# Patient Record
Sex: Female | Born: 1955 | Race: White | Hispanic: No | Marital: Married | Smoking: Never smoker
Health system: Southern US, Community
[De-identification: ages and names within clinical notes are randomized; demographics above are authoritative.]

## PROBLEM LIST (undated history)

## (undated) DIAGNOSIS — Z789 Other specified health status: Secondary | ICD-10-CM

## (undated) HISTORY — PX: NASAL SEPTUM SURGERY: SHX37

## (undated) HISTORY — PX: BREAST REDUCTION SURGERY: SHX8

## (undated) HISTORY — PX: FOOT SURGERY: SHX648

---

## 2001-12-16 ENCOUNTER — Other Ambulatory Visit: Admission: RE | Admit: 2001-12-16 | Discharge: 2001-12-16 | Payer: Self-pay | Admitting: Internal Medicine

## 2005-06-22 ENCOUNTER — Ambulatory Visit (HOSPITAL_COMMUNITY): Admission: RE | Admit: 2005-06-22 | Discharge: 2005-06-22 | Payer: Self-pay | Admitting: Gastroenterology

## 2008-05-03 ENCOUNTER — Ambulatory Visit: Payer: Self-pay | Admitting: Internal Medicine

## 2008-05-10 ENCOUNTER — Ambulatory Visit: Payer: Self-pay | Admitting: Internal Medicine

## 2009-05-04 ENCOUNTER — Ambulatory Visit: Payer: Self-pay | Admitting: Internal Medicine

## 2010-05-16 ENCOUNTER — Ambulatory Visit: Payer: Self-pay | Admitting: Internal Medicine

## 2011-05-21 ENCOUNTER — Ambulatory Visit: Payer: Self-pay | Admitting: Internal Medicine

## 2011-12-14 ENCOUNTER — Encounter: Payer: Self-pay | Admitting: Obstetrics and Gynecology

## 2011-12-28 ENCOUNTER — Encounter: Payer: Self-pay | Admitting: Obstetrics and Gynecology

## 2012-01-28 ENCOUNTER — Encounter: Payer: Self-pay | Admitting: Obstetrics and Gynecology

## 2012-02-27 ENCOUNTER — Encounter: Payer: Self-pay | Admitting: Obstetrics and Gynecology

## 2012-05-23 ENCOUNTER — Ambulatory Visit: Payer: Self-pay | Admitting: Internal Medicine

## 2013-05-25 ENCOUNTER — Ambulatory Visit: Payer: Self-pay | Admitting: Internal Medicine

## 2013-12-27 HISTORY — PX: REDUCTION MAMMAPLASTY: SUR839

## 2015-07-05 ENCOUNTER — Other Ambulatory Visit: Payer: Self-pay | Admitting: Obstetrics and Gynecology

## 2015-07-05 ENCOUNTER — Other Ambulatory Visit: Payer: Self-pay | Admitting: Internal Medicine

## 2015-07-05 DIAGNOSIS — Z1231 Encounter for screening mammogram for malignant neoplasm of breast: Secondary | ICD-10-CM

## 2015-07-06 ENCOUNTER — Ambulatory Visit
Admission: RE | Admit: 2015-07-06 | Discharge: 2015-07-06 | Disposition: A | Discharge status: Home / Self Care | Source: Ambulatory Visit | Attending: Obstetrics and Gynecology | Admitting: Obstetrics and Gynecology

## 2015-07-06 DIAGNOSIS — Z1231 Encounter for screening mammogram for malignant neoplasm of breast: Secondary | ICD-10-CM

## 2015-07-19 NOTE — H&P (Signed)
GYN PRE-OP H&P   Subjective:    Teresa Booth is a 59 y.o. female who presents for pre-op planning for D&C hysteroscopy, polypectomy for endometrial polyp noted on endometrial biopsy done for PMB.   Hx of PMB: Pt was in her usual state of health until end of july when she experienced 4 days of light spotting. No pelvic pain. No abnl discharge. Went through menopause 6 yrs ago. No dysparunia.   Gynecologic History No LMP recorded. Patient is postmenopausal. Sexually active: yes Desires STD screening: no  Last Pap: 3 years ago. Results were: normal Last mammogram: last year. Results were: normal  Obstetric History                      OB History  Gravida Para Term Preterm AB SAB TAB Ectopic Multiple Living  # Outcome Date GA Lbr Len/2nd Weight Sex Delivery Anes PTL Lv  2 Term           1 Term               Past Medical History:  has a past medical history of Allergic rhinitis; Osteopenia; Menopause (06/04/2014); and Urinary urgency (06/04/2014). Problem List: has Osteopenia; Urinary urgency; Menopause; and Health care maintenance on her problem list. Past Surgical History:  has past surgical history that includes foot surgery and Breast surgery (12/2013). Family History: family history includes Hypertension in her other; Pulmonary fibrosis in her father; Thyroid disease in her other. Social History:  reports that she has never smoked. She has never used smokeless tobacco. She reports that she drinks alcohol. She reports that she does not use illicit drugs. +seat belt use, no DV, no depression Current Medications: has a current medication list which includes the following prescription(s): cholecalciferol and saccharomyces boulardii. Prior to encounter Medications:  Current Outpatient Prescriptions on File Prior to Visit  Medication Sig Dispense Refill  . cholecalciferol (VITAMIN D3) 1,000 unit tablet Take 1,000 Units by mouth  once daily.    Marland Kitchen SACCHAROMYCES BOULARDII (PROBIOTIC, S.BOULARDII, ORAL) Take 1 tablet by mouth once daily.     No current facility-administered medications on file prior to visit.   Allergies: is allergic to nsaids (non-steroidal anti-inflammatory drug) and shellfish containing products.  Review of Systems 14 systems reviewed pertinent positives and negatives as noted in the HPI and below.   Objective:       Filed Vitals:   07/01/15 0954  BP: 160/91  Pulse: 63   General appearance: alert, appears stated age and cooperative Head: Normocephalic, without obvious abnormality, atraumatic Eyes: conjunctivae/corneas clear. PERRL, EOM's intact. Fundi benign. Sclera anicteric. Throat: lips, mucosa, and tongue normal; teeth and gums normal Neck: no adenopathy, supple, symmetrical, trachea midline and thyroid not enlarged, symmetric, no tenderness/mass/nodules Back: symmetric, no curvature. ROM normal. No CVA tenderness. Lungs: clear to auscultation bilaterally Breasts: normal appearance, no masses or tenderness Heart: regular rate and rhythm, S1, S2 normal, no murmur, click, rub or gallop Abdomen: soft, non-tender; bowel sounds normal; no masses, no organomegaly Pelvic: cervix normal in appearance, external genitalia normal, no adnexal masses or tenderness, no cervical motion tenderness, rectovaginal septum normal, uterus normal size, shape, and consistency and vagina normal without discharge Extremities: extremities normal, atraumatic, no cyanosis or edema Skin: Skin color, texture, turgor normal. No rashes or lesions Lymph nodes: Cervical, supraclavicular, and axillary nodes normal.    Results: Pap NILM Endometrial bx: no carcinoma;  atrophic endometrium, endometrial polyps TVUS: 4.42mm EMS, normal uterus, normal ovaries  Assessment:     59yo G2P2 w/ PMB and evidence of endometrial polyps on biopsy. Will proceed with D&C hysteroscopy polypectomy.   Plan:    1. PMB,  endometrial polyp in post-menopausal patient - D&C hysteroscopy polypectomy discussed with patient Risks of surgery were discussed with the patient including but not limited to: bleeding which may require transfusion; infection which may require antibiotics; injury to uterus or surrounding organs; intrauterine scarring which may impair future fertility; need for additional procedures including laparotomy or laparoscopy; and other postoperative/anesthesia complications.  - Written informed consent was obtained.  This is a scheduled same-day surgery. She will have a postop visit in 2 weeks to review operative findings and pathology.  I spent 15 minutes with patient with >50% spent on counseling.  Burnett Corrente, MD

## 2015-07-26 ENCOUNTER — Encounter
Admission: RE | Admit: 2015-07-26 | Discharge: 2015-07-26 | Disposition: A | Discharge status: Home / Self Care | Source: Ambulatory Visit | Attending: Obstetrics and Gynecology | Admitting: Obstetrics and Gynecology

## 2015-07-26 DIAGNOSIS — Z79899 Other long term (current) drug therapy: Secondary | ICD-10-CM | POA: Diagnosis not present

## 2015-07-26 DIAGNOSIS — J309 Allergic rhinitis, unspecified: Secondary | ICD-10-CM | POA: Diagnosis not present

## 2015-07-26 DIAGNOSIS — M8580 Other specified disorders of bone density and structure, unspecified site: Secondary | ICD-10-CM | POA: Diagnosis not present

## 2015-07-26 DIAGNOSIS — R102 Pelvic and perineal pain: Secondary | ICD-10-CM | POA: Diagnosis not present

## 2015-07-26 DIAGNOSIS — N950 Postmenopausal bleeding: Secondary | ICD-10-CM | POA: Diagnosis present

## 2015-07-26 DIAGNOSIS — R3915 Urgency of urination: Secondary | ICD-10-CM | POA: Diagnosis not present

## 2015-07-26 HISTORY — DX: Other specified health status: Z78.9

## 2015-07-26 LAB — CBC
HCT: 43.7 % (ref 35.0–47.0)
Hemoglobin: 14.8 g/dL (ref 12.0–16.0)
MCH: 31.4 pg (ref 26.0–34.0)
MCHC: 33.9 g/dL (ref 32.0–36.0)
MCV: 92.6 fL (ref 80.0–100.0)
PLATELETS: 187 10*3/uL (ref 150–440)
RBC: 4.72 MIL/uL (ref 3.80–5.20)
RDW: 12.9 % (ref 11.5–14.5)
WBC: 5.5 10*3/uL (ref 3.6–11.0)

## 2015-07-26 MED ORDER — MOXIFLOXACIN HCL 0.5 % OP SOLN
OPHTHALMIC | Status: AC
  Filled 2015-07-26: qty 3

## 2015-07-26 NOTE — Patient Instructions (Signed)
  Your procedure is scheduled on: 07/29/15 Fri  Report to Day Surgery.2nd floor Medical [facility] To find out your arrival time please call [phone] between 1PM - 3PM on 07/28/15 Thurs  Remember: Instructions that are not followed completely may result in serious medical risk, up to and including death, or upon the discretion of your surgeon and anesthesiologist your surgery may need to be rescheduled.    _x___ 1. Do not eat food or drink liquids after midnight. No gum chewing or hard candies.     __x__ 2. No Alcohol for 24 hours before or after surgery.   ____ 3. Bring all medications with you on the day of surgery if instructed.    __x__ 4. Notify your doctor if there is any change in your medical condition     (cold, fever, infections).     Do not wear jewelry, make-up, hairpins, clips or nail polish.  Do not wear lotions, powders, or perfumes. You may wear deodorant.  Do not shave 48 hours prior to surgery. Men may shave face and neck.  Do not bring valuables to the hospital.    [facility] is not responsible for any belongings or valuables.               Contacts, dentures or bridgework may not be worn into surgery.  Leave your suitcase in the car. After surgery it may be brought to your room.  For patients admitted to the hospital, discharge time is determined by your                treatment team.   Patients discharged the day of surgery will not be allowed to drive home.   Please read over the following fact sheets that you were given:      ____ Take these medicines the morning of surgery with A SIP OF WATER:    1. none  2.   3.   4.  5.  6.  ____ Fleet Enema (as directed)   ____ Use CHG Soap as directed  ____ Use inhalers on the day of surgery  ____ Stop metformin 2 days prior to surgery    ____ Take 1/2 of usual insulin dose the night before surgery and none on the morning of surgery.   ____ Stop Coumadin/Plavix/aspirin on   ____ Stop Anti-inflammatories on     ____ Stop supplements until after surgery.    ____ Bring C-Pap to the hospital.

## 2015-07-29 ENCOUNTER — Encounter
Admission: RE | Disposition: A | Discharge status: Home / Self Care | Payer: Self-pay | Source: Ambulatory Visit | Attending: Obstetrics and Gynecology

## 2015-07-29 ENCOUNTER — Ambulatory Visit
Admission: RE | Admit: 2015-07-29 | Discharge: 2015-07-29 | Disposition: A | Discharge status: Home / Self Care | Source: Ambulatory Visit | Attending: Obstetrics and Gynecology | Admitting: Obstetrics and Gynecology

## 2015-07-29 ENCOUNTER — Ambulatory Visit: Admitting: *Deleted

## 2015-07-29 DIAGNOSIS — M8580 Other specified disorders of bone density and structure, unspecified site: Secondary | ICD-10-CM | POA: Insufficient documentation

## 2015-07-29 DIAGNOSIS — J309 Allergic rhinitis, unspecified: Secondary | ICD-10-CM | POA: Insufficient documentation

## 2015-07-29 DIAGNOSIS — R3915 Urgency of urination: Secondary | ICD-10-CM | POA: Insufficient documentation

## 2015-07-29 DIAGNOSIS — Z79899 Other long term (current) drug therapy: Secondary | ICD-10-CM | POA: Insufficient documentation

## 2015-07-29 DIAGNOSIS — R102 Pelvic and perineal pain: Secondary | ICD-10-CM | POA: Insufficient documentation

## 2015-07-29 DIAGNOSIS — N950 Postmenopausal bleeding: Secondary | ICD-10-CM | POA: Diagnosis not present

## 2015-07-29 HISTORY — PX: HYSTEROSCOPY W/D&C: SHX1775

## 2015-07-29 SURGERY — DILATATION AND CURETTAGE /HYSTEROSCOPY
Anesthesia: General

## 2015-07-29 MED ORDER — MIDAZOLAM HCL 2 MG/2ML IJ SOLN
INTRAMUSCULAR | Status: DC | PRN
Start: 2015-07-29 — End: 2015-07-29
  Administered 2015-07-29: 2 mg via INTRAVENOUS

## 2015-07-29 MED ORDER — PROMETHAZINE HCL 25 MG/ML IJ SOLN: 6.2500 mg | INTRAMUSCULAR | Status: DC | PRN

## 2015-07-29 MED ORDER — KETOROLAC TROMETHAMINE 15 MG/ML IJ SOLN: 15.0000 mg | Freq: Four times a day (QID) | INTRAMUSCULAR | Status: DC

## 2015-07-29 MED ORDER — LACTATED RINGERS IV SOLN: INTRAVENOUS | Status: DC

## 2015-07-29 MED ORDER — PROPOFOL 10 MG/ML IV BOLUS
INTRAVENOUS | Status: DC | PRN
  Administered 2015-07-29: 150 mg via INTRAVENOUS
  Administered 2015-07-29: 50 mg via INTRAVENOUS

## 2015-07-29 MED ORDER — OXYCODONE HCL 5 MG PO TABS: 5.0000 mg | ORAL_TABLET | ORAL | Status: DC | PRN

## 2015-07-29 MED ORDER — FAMOTIDINE 20 MG PO TABS
ORAL_TABLET | ORAL | Status: DC
Start: 2015-07-29 — End: 2015-07-29
  Filled 2015-07-29: qty 1

## 2015-07-29 MED ORDER — DIPHENHYDRAMINE HCL 12.5 MG/5ML PO ELIX: 12.5000 mg | ORAL_SOLUTION | Freq: Four times a day (QID) | ORAL | Status: DC | PRN

## 2015-07-29 MED ORDER — ONDANSETRON HCL 4 MG/2ML IJ SOLN: 4.0000 mg | Freq: Four times a day (QID) | INTRAMUSCULAR | Status: DC | PRN

## 2015-07-29 MED ORDER — FENTANYL CITRATE (PF) 100 MCG/2ML IJ SOLN: 25.0000 ug | INTRAMUSCULAR | Status: DC | PRN

## 2015-07-29 MED ORDER — ONDANSETRON 4 MG PO TBDP: 4.0000 mg | ORAL_TABLET | Freq: Four times a day (QID) | ORAL | Status: DC | PRN

## 2015-07-29 MED ORDER — LACTATED RINGERS IV SOLN
INTRAVENOUS | Status: DC
  Administered 2015-07-29: 08:00:00 via INTRAVENOUS

## 2015-07-29 MED ORDER — ACETAMINOPHEN 650 MG RE SUPP: 650.0000 mg | Freq: Four times a day (QID) | RECTAL | Status: DC | PRN

## 2015-07-29 MED ORDER — ONDANSETRON HCL 4 MG/2ML IJ SOLN
INTRAMUSCULAR | Status: DC | PRN
  Administered 2015-07-29: 4 mg via INTRAVENOUS

## 2015-07-29 MED ORDER — ACETAMINOPHEN 325 MG PO TABS: 650.0000 mg | ORAL_TABLET | Freq: Four times a day (QID) | ORAL | Status: DC | PRN

## 2015-07-29 MED ORDER — FAMOTIDINE 20 MG PO TABS
20.0000 mg | ORAL_TABLET | Freq: Once | ORAL | Status: AC
  Administered 2015-07-29: 20 mg via ORAL

## 2015-07-29 MED ORDER — FENTANYL CITRATE (PF) 100 MCG/2ML IJ SOLN
INTRAMUSCULAR | Status: DC | PRN
  Administered 2015-07-29 (×2): 50 ug via INTRAVENOUS

## 2015-07-29 MED ORDER — KETOROLAC TROMETHAMINE 15 MG/ML IJ SOLN: 15.0000 mg | Freq: Four times a day (QID) | INTRAMUSCULAR | Status: DC | PRN

## 2015-07-29 MED ORDER — DIPHENHYDRAMINE HCL 50 MG/ML IJ SOLN: 12.5000 mg | Freq: Four times a day (QID) | INTRAMUSCULAR | Status: DC | PRN

## 2015-07-29 SURGICAL SUPPLY — 17 items
CANISTER SUCT 3000ML (MISCELLANEOUS) ×3 IMPLANT
CATH ROBINSON RED A/P 16FR (CATHETERS) ×3 IMPLANT
ELECT RESECT POWERBALL 24F (MISCELLANEOUS) ×3 IMPLANT
GLOVE BIO SURGEON STRL SZ7 (GLOVE) ×3 IMPLANT
GLOVE BIOGEL PI IND STRL 7.5 (GLOVE) ×1 IMPLANT
GLOVE BIOGEL PI INDICATOR 7.5 (GLOVE) ×2
GOWN STRL REUS W/ TWL LRG LVL3 (GOWN DISPOSABLE) ×2 IMPLANT
GOWN STRL REUS W/TWL LRG LVL3 (GOWN DISPOSABLE) ×4
IV LACTATED RINGERS 1000ML (IV SOLUTION) ×3 IMPLANT
KIT RM TURNOVER CYSTO AR (KITS) ×3 IMPLANT
LOOP CUT RT ANGL 28F (MISCELLANEOUS) ×3 IMPLANT
PACK DNC HYST (MISCELLANEOUS) ×3 IMPLANT
PAD GROUND ADULT SPLIT (MISCELLANEOUS) ×3 IMPLANT
PAD OB MATERNITY 4.3X12.25 (PERSONAL CARE ITEMS) ×3 IMPLANT
PAD PREP 24X41 OB/GYN DISP (PERSONAL CARE ITEMS) ×3 IMPLANT
TUBING CONNECTING 10 (TUBING) ×2 IMPLANT
TUBING CONNECTING 10' (TUBING) ×1

## 2015-07-29 NOTE — Anesthesia Procedure Notes (Signed)
Procedure Name: LMA Insertion Date/Time: 07/29/2015 8:37 AM Performed by: Stormy Fabian Pre-anesthesia Checklist: Patient identified, Patient being monitored, Timeout performed, Emergency Drugs available and Suction available Patient Re-evaluated:Patient Re-evaluated prior to inductionOxygen Delivery Method: Circle system utilized Preoxygenation: Pre-oxygenation with 100% oxygen Intubation Type: IV induction Ventilation: Mask ventilation without difficulty LMA: LMA inserted LMA Size: 3.5 Tube type: Oral Number of attempts: 1 Placement Confirmation: positive ETCO2 and breath sounds checked- equal and bilateral Tube secured with: Tape Dental Injury: Teeth and Oropharynx as per pre-operative assessment

## 2015-07-29 NOTE — Discharge Instructions (Signed)
Hysteroscopy, Care After °Refer to this sheet in the next few weeks. These instructions provide you with information on caring for yourself after your procedure. Your health care provider may also give you more specific instructions. Your treatment has been planned according to current medical practices, but problems sometimes occur. Call your health care provider if you have any problems or questions after your procedure.  °WHAT TO EXPECT AFTER THE PROCEDURE °After your procedure, it is typical to have the following: °· You may have some cramping. This normally lasts for a couple days. °· You may have bleeding. This can vary from light spotting for a few days to menstrual-like bleeding for 3-7 days. °HOME CARE INSTRUCTIONS °· Rest for the first 1-2 days after the procedure. °· Only take over-the-counter or prescription medicines as directed by your health care provider. Do not take aspirin. It can increase the chances of bleeding. °· Take showers instead of baths for 2 weeks or as directed by your health care provider. °· Do not drive for 24 hours or as directed. °· Do not drink alcohol while taking pain medicine. °· Do not use tampons, douche, or have sexual intercourse for 2 weeks or until your health care provider says it is okay. °· Take your temperature twice a day for 4-5 days. Write it down each time. °· Follow your health care provider's advice about diet, exercise, and lifting. °· If you develop constipation, you may: °¨ Take a mild laxative if your health care provider approves. °¨ Add bran foods to your diet. °¨ Drink enough fluids to keep your urine clear or pale yellow. °· Try to have someone with you or available to you for the first 24-48 hours, especially if you were given a general anesthetic. °· Follow up with your health care provider as directed. °SEEK MEDICAL CARE IF: °· You feel dizzy or lightheaded. °· You feel sick to your stomach (nauseous). °· You have abnormal vaginal discharge. °· You  have a rash. °· You have pain that is not controlled with medicine. °SEEK IMMEDIATE MEDICAL CARE IF: °· You have bleeding that is heavier than a normal menstrual period. °· You have a fever. °· You have increasing cramps or pain, not controlled with medicine. °· You have new belly (abdominal) pain. °· You pass out. °· You have pain in the tops of your shoulders (shoulder strap areas). °· You have shortness of breath. °Document Released: 08/05/2013 Document Reviewed: 08/05/2013 °[employer]® Patient Information ©2015 [employer]. This information is not intended to replace advice given to you by your health care provider. Make sure you discuss any questions you have with your health care provider. ° °

## 2015-07-29 NOTE — Anesthesia Preprocedure Evaluation (Signed)
Anesthesia Evaluation  Patient identified by MRN, date of birth, ID band Patient awake    Reviewed: Allergy & Precautions, H&P , NPO status , Patient's Chart, lab work & pertinent test results, reviewed documented beta blocker date and time   History of Anesthesia Complications Negative for: history of anesthetic complications  Airway Mallampati: I  TM Distance: >3 FB Neck ROM: full    Dental no notable dental hx. (+) Teeth Intact Crowns on bilateral lower molars:   Pulmonary neg pulmonary ROS,    Pulmonary exam normal breath sounds clear to auscultation       Cardiovascular Exercise Tolerance: Good negative cardio ROS Normal cardiovascular exam Rhythm:regular Rate:Normal     Neuro/Psych negative neurological ROS  negative psych ROS   GI/Hepatic negative GI ROS, Neg liver ROS,   Endo/Other  negative endocrine ROS  Renal/GU negative Renal ROS  negative genitourinary   Musculoskeletal   Abdominal   Peds  Hematology negative hematology ROS (+)   Anesthesia Other Findings Past Medical History:   Medical history non-contributory                             Reproductive/Obstetrics negative OB ROS                             Anesthesia Physical Anesthesia Plan  ASA: I  Anesthesia Plan: General   Post-op Pain Management:    Induction:   Airway Management Planned:   Additional Equipment:   Intra-op Plan:   Post-operative Plan:   Informed Consent: I have reviewed the patients History and Physical, chart, labs and discussed the procedure including the risks, benefits and alternatives for the proposed anesthesia with the patient or authorized representative who has indicated his/her understanding and acceptance.   Dental Advisory Given  Plan Discussed with: Anesthesiologist, CRNA and Surgeon  Anesthesia Plan Comments:         Anesthesia Quick Evaluation

## 2015-07-29 NOTE — Op Note (Signed)
Operative Report Hysteroscopy with Dilation and Curettage   Indications: Postmenopausal bleeding   Pre-operative Diagnosis: Endometrial polyp   Post-operative Diagnosis: Benign endometrial tissue  Procedure: 1. Exam under anesthesia 2. D&C 3. Hysteroscopy  Surgeon: Burnett Corrente, MD  Assistant(s):  None  Anesthesia: General endotracheal anesthesia  Estimated Blood Loss:  Minimal         Intraoperative medications: none         Total IV Fluids: ml  Urine Output: 30ml         Specimens:  endometrial curettings         Complications:  None; patient tolerated the procedure well.         Disposition: PACU - hemodynamically stable.         Condition: stable  Findings: Uterus measuring 7 cm by sound; normal cervix, vagina, perineum.  Fluffy endometrium at fundus, otherwise clean endometrium without polyp, normal ostium bilaterally  Indication for procedure/Consents: 59 y.o.  G2P2 w/ postmenopausal bleeding and evidence of endometrial poylp on biopsy here for scheduled surgery for the aforementioned diagnoses. Risks of surgery were discussed with the patient including but not limited to: bleeding which may require transfusion; infection which may require antibiotics; injury to uterus or surrounding organs; intrauterine scarring which may impair future fertility; need for additional procedures including laparotomy or laparoscopy; and other postoperative/anesthesia complications. Written informed consent was obtained.    Procedure Details:   The patient was taken to the operating room where general endotracheal anesthesia was administered and was found to be adequate. After a formal and adequate timeout was performed, she was placed in the dorsal lithotomy position and examined with the above findings. She was then prepped and draped in the sterile manner. Her bladder was catheterized for an estimated amount of clear, yellow urine. A weighed speculum was then placed in the  patient's vagina and a single tooth tenaculum was applied to the anterior lip of the cervix.  Her cervix was serially dilated to 17 [place] using [employer].The hysteroscope was introduced to reveal the above findings. A sharp curettage was then performed until there was a gritty texture in all four quadrants. The tenaculum was removed from the anterior lip of the cervix and the vaginal speculum was removed The patient tolerated the procedure well and was taken to the recovery area awake and in stable condition.   The patient will be discharged to home as per PACU criteria. Routine postoperative instructions given. She was prescribed Ibuprofen and Colace. She will follow up in the clinic in two weeks for postoperative evaluation.

## 2015-07-29 NOTE — Transfer of Care (Signed)
Immediate Anesthesia Transfer of Care Note  Patient: Teresa Booth  Procedure(s) Performed: Procedure(s): DILATATION AND CURETTAGE /HYSTEROSCOPY (N/A)  Patient Location: PACU  Anesthesia Type:General  Level of Consciousness: awake, alert  and oriented  Airway & Oxygen Therapy: Patient Spontanous Breathing and Patient connected to face mask oxygen  Post-op Assessment: Report given to RN, Post -op Vital signs reviewed and stable, Patient moving all extremities and Patient moving all extremities X 4  Post vital signs: Reviewed and stable  Last Vitals:  Filed Vitals:   07/29/15 0915  BP: 127/84  Pulse: 58  Temp: 35.9 C  Resp: 12    Complications: No apparent anesthesia complications

## 2015-07-29 NOTE — Interval H&P Note (Signed)
History and Physical Interval Note:  07/29/2015 8:24 AM  Teresa Booth  has presented today for surgery, with the diagnosis of ENDOMETRIAL POLYP  The various methods of treatment have been discussed with the patient and family. After consideration of risks, benefits and other options for treatment, the patient has consented to  Procedure(s): DILATATION AND CURETTAGE /HYSTEROSCOPY (N/A) as a surgical intervention .  The patient's history has been reviewed, patient examined, no change in status, stable for surgery.  I have reviewed the patient's chart and labs.  Questions were answered to the patient's satisfaction.     Leonette Most Halfon

## 2015-08-01 LAB — SURGICAL PATHOLOGY

## 2015-08-04 NOTE — Anesthesia Postprocedure Evaluation (Signed)
  Anesthesia Post-op Note  Patient: Teresa Booth  Procedure(s) Performed: Procedure(s): DILATATION AND CURETTAGE /HYSTEROSCOPY (N/A)  Anesthesia type:General  Patient location: PACU  Post pain: Pain level controlled  Post assessment: Post-op Vital signs reviewed, Patient's Cardiovascular Status Stable, Respiratory Function Stable, Patent Airway and No signs of Nausea or vomiting  Post vital signs: Reviewed and stable  Last Vitals:  Filed Vitals:   07/29/15 1022  BP: 144/77  Pulse: 50  Temp:   Resp: 14    Level of consciousness: awake, alert  and patient cooperative  Complications: No apparent anesthesia complications

## 2016-08-31 ENCOUNTER — Other Ambulatory Visit: Payer: Self-pay | Admitting: Internal Medicine

## 2016-08-31 DIAGNOSIS — Z1231 Encounter for screening mammogram for malignant neoplasm of breast: Secondary | ICD-10-CM

## 2016-10-05 ENCOUNTER — Ambulatory Visit
Admission: RE | Admit: 2016-10-05 | Discharge: 2016-10-05 | Disposition: A | Discharge status: Home / Self Care | Source: Ambulatory Visit | Attending: Internal Medicine | Admitting: Internal Medicine

## 2016-10-05 DIAGNOSIS — Z1231 Encounter for screening mammogram for malignant neoplasm of breast: Secondary | ICD-10-CM | POA: Diagnosis present

## 2018-03-14 ENCOUNTER — Other Ambulatory Visit: Payer: Self-pay | Admitting: Internal Medicine

## 2018-03-14 DIAGNOSIS — Z1231 Encounter for screening mammogram for malignant neoplasm of breast: Secondary | ICD-10-CM

## 2018-03-20 ENCOUNTER — Ambulatory Visit
Admission: RE | Admit: 2018-03-20 | Discharge: 2018-03-20 | Disposition: A | Discharge status: Home / Self Care | Source: Ambulatory Visit | Attending: Internal Medicine | Admitting: Internal Medicine

## 2018-03-20 DIAGNOSIS — Z1231 Encounter for screening mammogram for malignant neoplasm of breast: Secondary | ICD-10-CM | POA: Diagnosis not present

## 2019-12-05 ENCOUNTER — Ambulatory Visit: Payer: TRICARE For Life (TFL) | Attending: Internal Medicine

## 2019-12-05 DIAGNOSIS — Z23 Encounter for immunization: Secondary | ICD-10-CM | POA: Insufficient documentation

## 2019-12-05 NOTE — Progress Notes (Signed)
   Covid-19 Vaccination Clinic  Name:  SALIA CANGEMI    MRN: [id number] DOB: 10/10/1956  12/05/2019  Ms. Stucki was observed post Covid-19 immunization for 15 minutes without incidence. She was provided with Vaccine Information Sheet and instruction to access the V-Safe system.   Ms. Castronovo was instructed to call 911 with any severe reactions post vaccine: Marland Kitchen Difficulty breathing  . Swelling of your face and throat  . A fast heartbeat  . A bad rash all over your body  . Dizziness and weakness    Immunizations Administered    Name Date Dose VIS Date Route   Pfizer COVID-19 Vaccine 12/05/2019  9:18 AM 0.3 mL 10/09/2019 Intramuscular   Manufacturer: [employer], [employer]   Lot: [id number]   NDC: [address]-9

## 2019-12-29 ENCOUNTER — Ambulatory Visit: Payer: TRICARE For Life (TFL) | Attending: Internal Medicine

## 2019-12-29 DIAGNOSIS — Z23 Encounter for immunization: Secondary | ICD-10-CM

## 2019-12-29 NOTE — Progress Notes (Signed)
   Covid-19 Vaccination Clinic  Name:  Teresa Booth    MRN: [id number] DOB: 08-Mar-1956  12/29/2019  Teresa Booth was observed post Covid-19 immunization for 15 minutes without incident. She was provided with Vaccine Information Sheet and instruction to access the V-Safe system.   Teresa Booth was instructed to call 911 with any severe reactions post vaccine: Marland Kitchen Difficulty breathing  . Swelling of face and throat  . A fast heartbeat  . A bad rash all over body  . Dizziness and weakness   Immunizations Administered    Name Date Dose VIS Date Route   Pfizer COVID-19 Vaccine 12/29/2019  9:01 AM 0.3 mL 10/09/2019 Intramuscular   Manufacturer: [employer], [employer]   Lot: [id number]   NDC: [address]-3

## 2020-02-16 ENCOUNTER — Other Ambulatory Visit: Payer: Self-pay | Admitting: Internal Medicine

## 2020-02-16 DIAGNOSIS — Z1231 Encounter for screening mammogram for malignant neoplasm of breast: Secondary | ICD-10-CM

## 2020-02-25 ENCOUNTER — Ambulatory Visit
Admission: RE | Admit: 2020-02-25 | Discharge: 2020-02-25 | Disposition: A | Discharge status: Home / Self Care | Payer: Medicare Other | Source: Ambulatory Visit | Attending: Internal Medicine | Admitting: Internal Medicine

## 2020-02-25 DIAGNOSIS — Z1231 Encounter for screening mammogram for malignant neoplasm of breast: Secondary | ICD-10-CM | POA: Diagnosis present

## 2021-07-31 ENCOUNTER — Other Ambulatory Visit: Payer: Self-pay | Admitting: Internal Medicine

## 2021-07-31 DIAGNOSIS — Z1231 Encounter for screening mammogram for malignant neoplasm of breast: Secondary | ICD-10-CM

## 2021-08-14 ENCOUNTER — Other Ambulatory Visit: Payer: Self-pay

## 2021-08-14 ENCOUNTER — Ambulatory Visit
Admission: RE | Admit: 2021-08-14 | Discharge: 2021-08-14 | Disposition: A | Discharge status: Home / Self Care | Payer: Medicare Other | Source: Ambulatory Visit | Attending: Internal Medicine | Admitting: Internal Medicine

## 2021-08-14 DIAGNOSIS — Z1231 Encounter for screening mammogram for malignant neoplasm of breast: Secondary | ICD-10-CM | POA: Insufficient documentation

## 2023-04-08 IMAGING — MG MM DIGITAL SCREENING BILAT W/ TOMO AND CAD
8 series · 8 of 24 positions shown · non-contrast
Comparison: Previous exam(s).

CLINICAL DATA: Screening.

EXAM:
DIGITAL SCREENING BILATERAL MAMMOGRAM WITH TOMOSYNTHESIS AND CAD
TECHNIQUE: Bilateral screening digital craniocaudal and mediolateral oblique
mammograms were obtained. Bilateral screening digital breast
tomosynthesis was performed. The images were evaluated with
computer-aided detection.

[L MLO synth-2D]
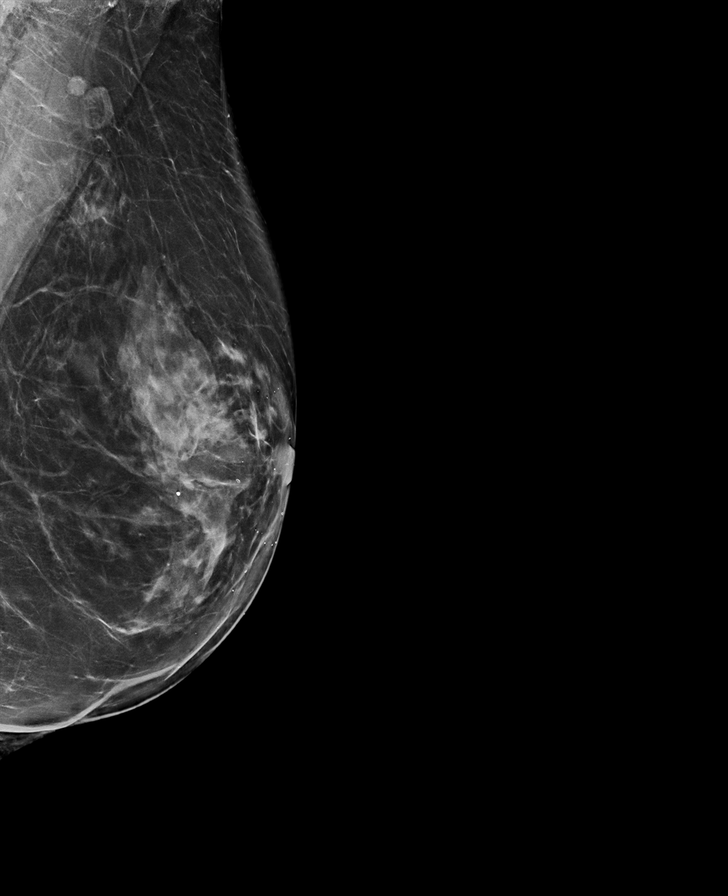

[R MLO synth-2D]
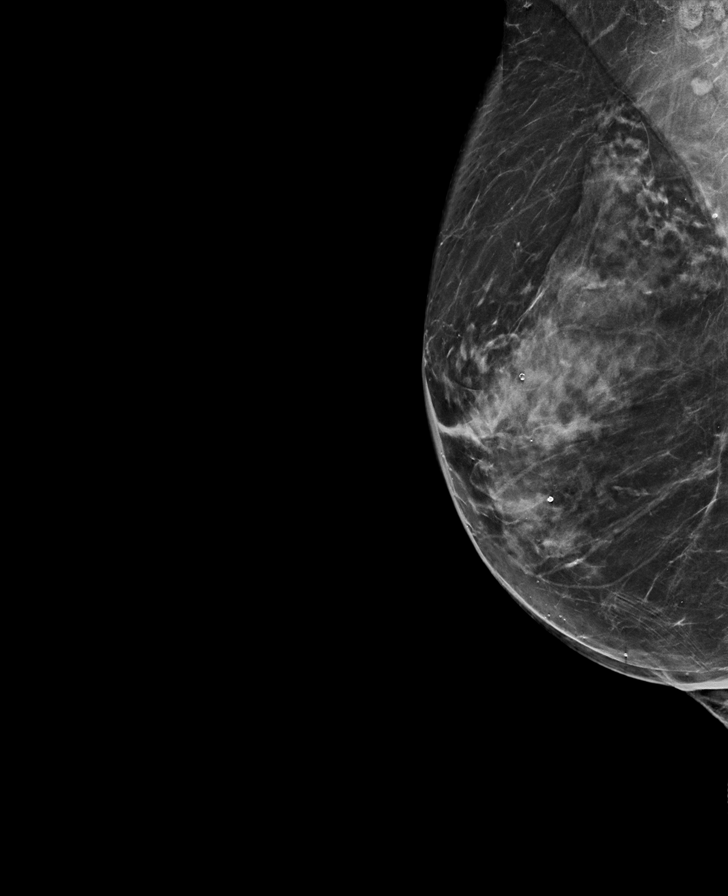

[R CC synth-2D]
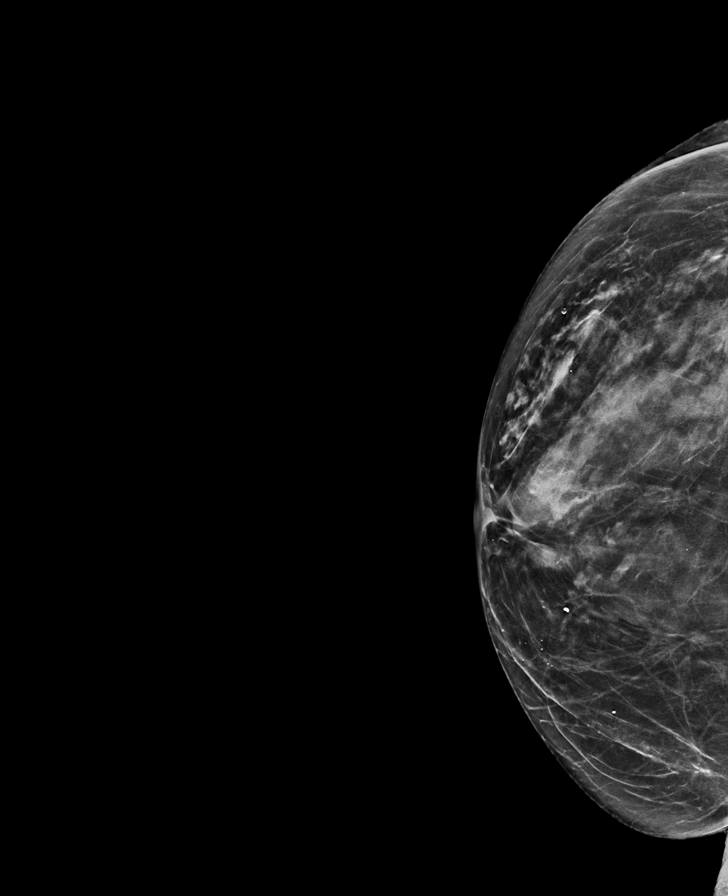

[L CC synth-2D]
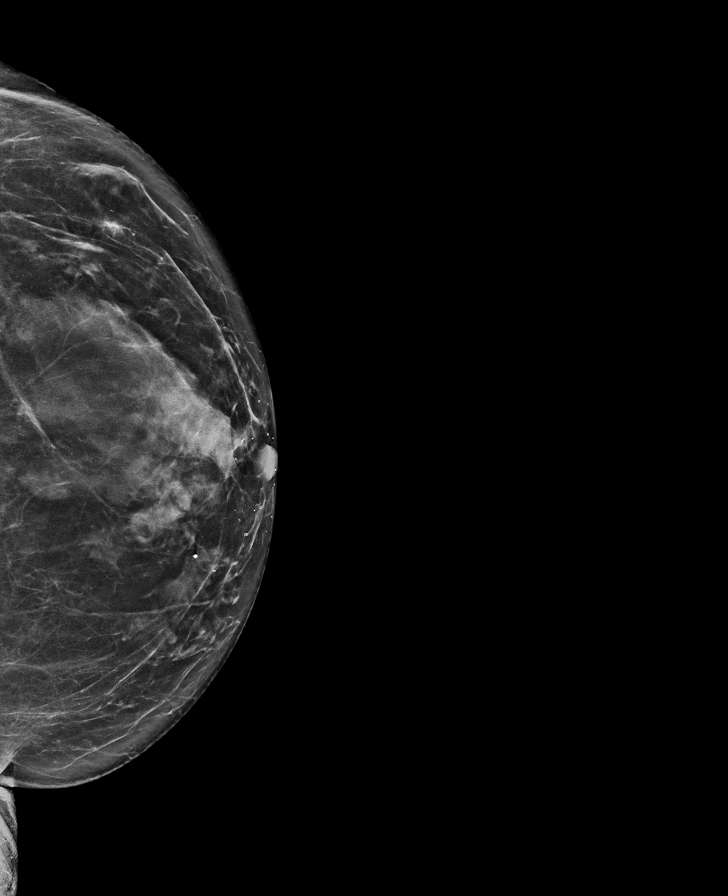

[L MLO tomo · tomo slice 45/90.0]
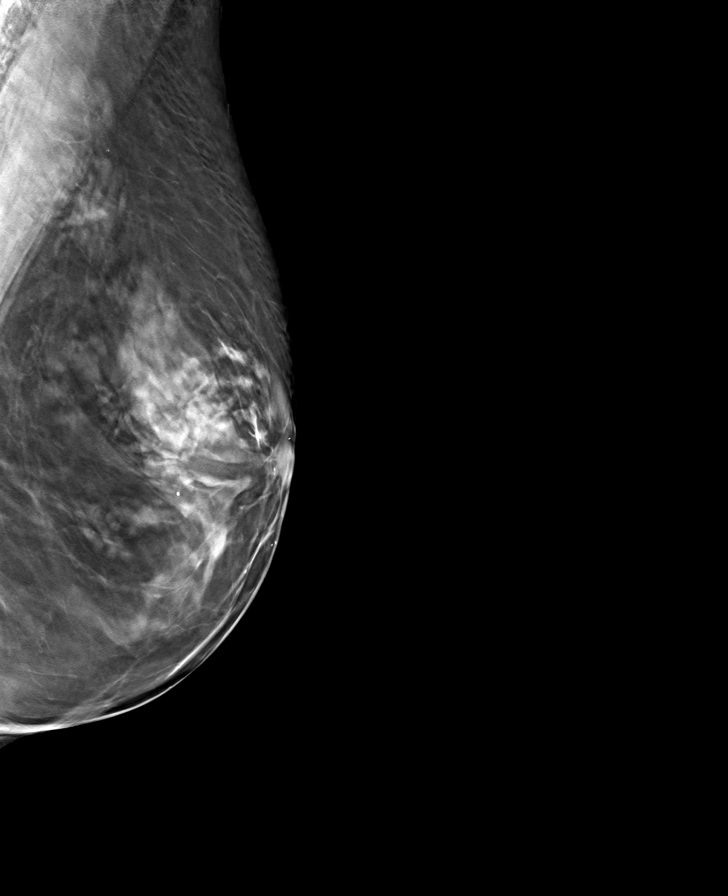

[L CC tomo · tomo slice 37/72.0]
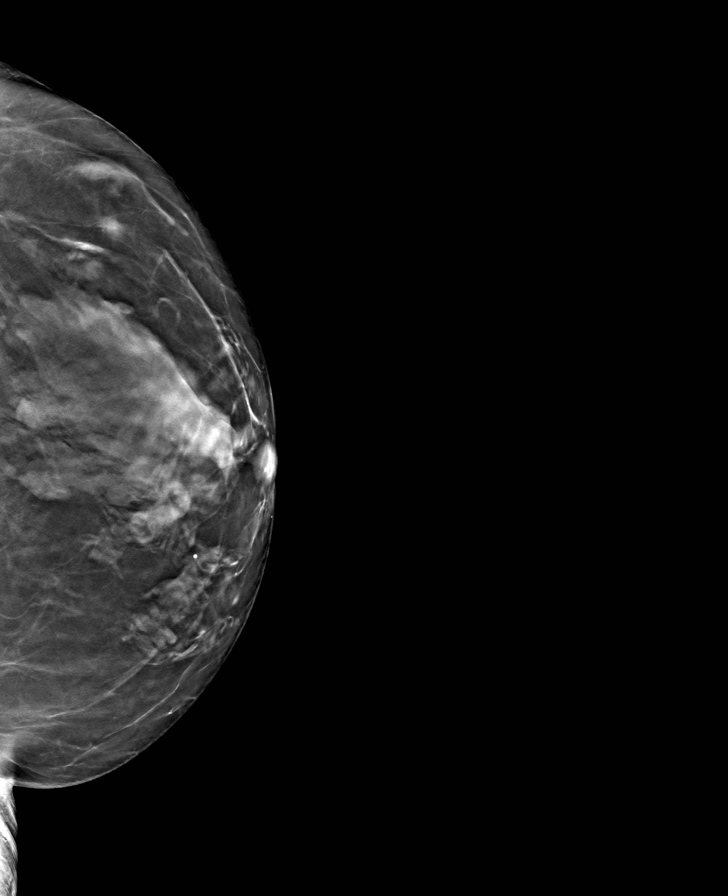

[R MLO tomo · tomo slice 43/85.0]
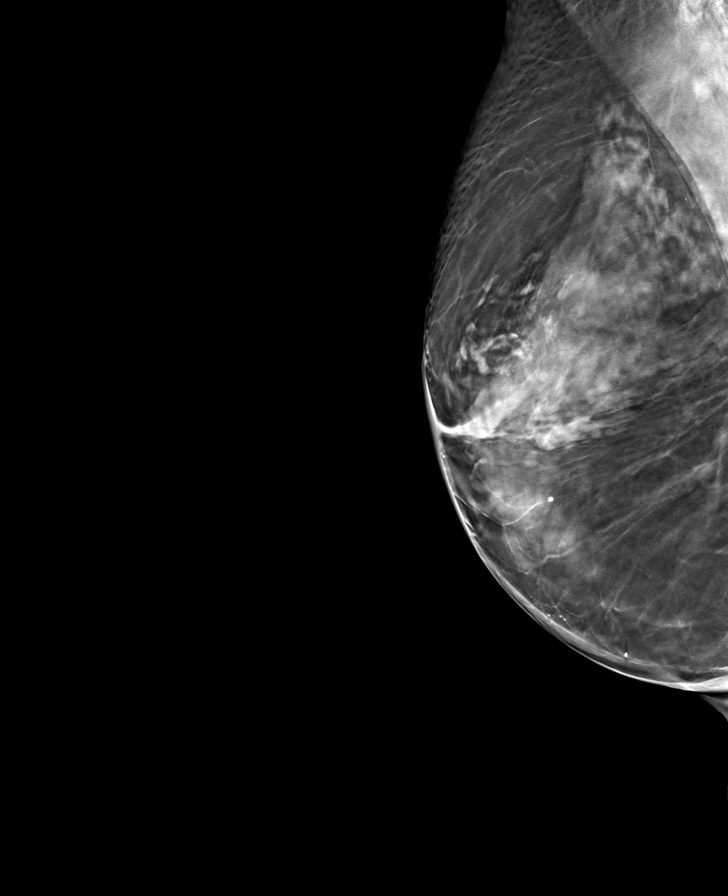

[R CC tomo · tomo slice 33/64.0]
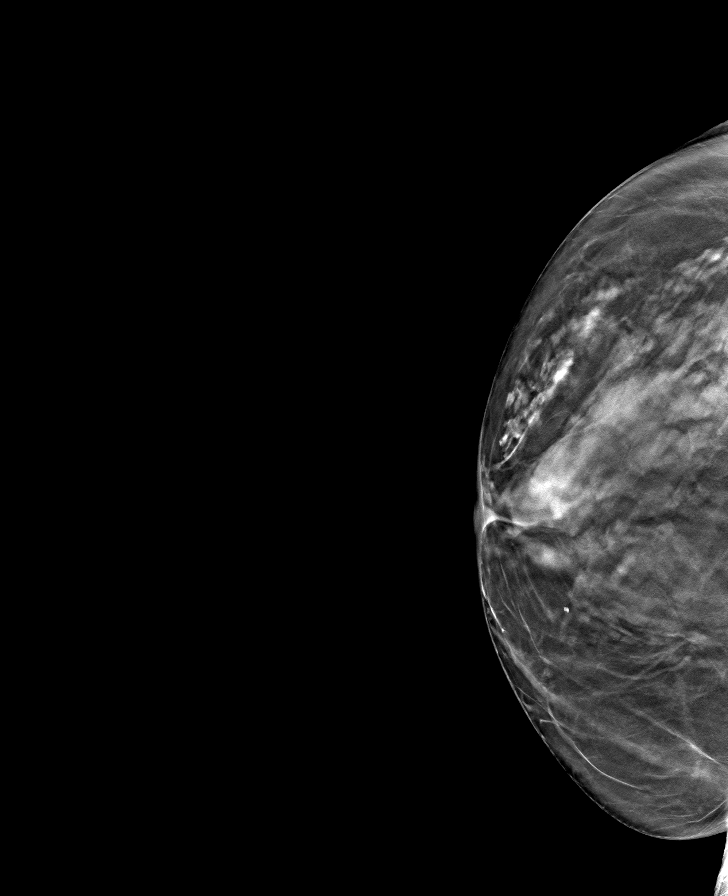

[8 of 24 positions shown; findings below may reference images not displayed]

ACR Breast Density Category c: The breast tissue is heterogeneously
dense, which may obscure small masses.
FINDINGS: There are no findings suspicious for malignancy.
IMPRESSION: No mammographic evidence of malignancy. A result letter of this
screening mammogram will be mailed directly to the patient.

RECOMMENDATION:
Screening mammogram in one year. (Code:Q3-W-BC3)

BI-RADS CATEGORY  1: Negative.

## 2023-05-27 ENCOUNTER — Other Ambulatory Visit: Payer: Self-pay | Admitting: Internal Medicine

## 2023-05-27 DIAGNOSIS — Z1231 Encounter for screening mammogram for malignant neoplasm of breast: Secondary | ICD-10-CM

## 2023-06-04 ENCOUNTER — Ambulatory Visit
Admission: RE | Admit: 2023-06-04 | Discharge: 2023-06-04 | Disposition: A | Discharge status: Home / Self Care | Payer: Medicare Other | Source: Ambulatory Visit | Attending: Internal Medicine | Admitting: Internal Medicine

## 2023-06-04 DIAGNOSIS — Z1231 Encounter for screening mammogram for malignant neoplasm of breast: Secondary | ICD-10-CM | POA: Insufficient documentation
